# Patient Record
Sex: Female | Born: 1990 | Race: Asian | Hispanic: No | Marital: Married | Smoking: Never smoker
Health system: Southern US, Community
[De-identification: ages and names within clinical notes are randomized; demographics above are authoritative.]

---

## 2011-11-04 ENCOUNTER — Emergency Department
Admission: EM | Admit: 2011-11-04 | Discharge: 2011-11-04 | Disposition: A | Discharge status: Home / Self Care | Payer: Self-pay | Source: Home / Self Care | Attending: Family Medicine | Admitting: Family Medicine

## 2011-11-04 ENCOUNTER — Encounter: Payer: Self-pay | Admitting: Emergency Medicine

## 2011-11-04 DIAGNOSIS — L03031 Cellulitis of right toe: Secondary | ICD-10-CM

## 2011-11-04 DIAGNOSIS — L03039 Cellulitis of unspecified toe: Secondary | ICD-10-CM

## 2011-11-04 MED ORDER — CEPHALEXIN 500 MG PO CAPS: 500.0000 mg | ORAL_CAPSULE | Freq: Three times a day (TID) | ORAL | Status: AC

## 2011-11-04 NOTE — ED Provider Notes (Signed)
History     CSN: [id number]  Arrival date & time 11/04/11  1101   First MD Initiated Contact with Patient 11/04/11 1211      Chief Complaint  Patient presents with  . Toe Injury      HPI Comments: Patient reports dropping heavy bag on right large toe 2 weeks ago; then stubbed it and bumped it a couple times over past 2 weeks; continues to have liquid (non-blood) discharge when she presses on it, which also causes pain.  Patient is a 21 y.o. female presenting with toe pain. The history is provided by the patient.  Toe Pain This is a new problem. Episode onset: 2 weeks ago. The problem occurs constantly. The problem has not changed since onset.Associated symptoms comments: Drainage from toenail. Nothing aggravates the symptoms. Nothing relieves the symptoms. Treatments tried: soaks. The treatment provided no relief.    History reviewed. No pertinent past medical history.  History reviewed. No pertinent past surgical history.  History reviewed. No pertinent family history.  History  Substance Use Topics  . Smoking status: Never Smoker   . Smokeless tobacco: Not on file  . Alcohol Use: No    OB History    Grav Para Term Preterm Abortions TAB SAB Ect Mult Living                  Review of Systems  All other systems reviewed and are negative.    Allergies  Review of patient's allergies indicates no known allergies.  Home Medications   Current Outpatient Rx  Name Route Sig Dispense Refill  . CEPHALEXIN 500 MG PO CAPS Oral Take 1 capsule (500 mg total) by mouth 3 (three) times daily. 30 capsule 0    BP 97/62  Pulse 80  Temp 98.4 F (36.9 C) (Oral)  Resp 16  Ht 5\' 5"  (1.651 m)  Wt 146 lb (66.225 kg)  BMI 24.30 kg/m2  SpO2 100%  LMP 10/19/2011  Physical Exam Nursing notes and Vital Signs reviewed. Appearance:  Patient appears healthy, stated age, and in no acute distress Eyes:  Pupils are equal, round, and reactive to light and accomodation.  Extraocular  movement is intact.  Conjunctivae are not inflamed  Extremities:  No edema.  No calf tenderness.  Right great toe has mild tenderness distal phalanx at edges of toenail, but no erythema or swelling.  Toenail is loose and a small amount of discharge can be expressed from beneath nail.  Distal Neurovascular function is intact.  Skin:  No rash present.   ED Course  Procedures  none   Labs Reviewed  WOUND CULTURE pending      1. Paronychia of great toe of right foot       MDM  Wound culture pending Begin Keflex Followup with Family Doctor if not improved in 10 days.        Lattie Haw, MD 11/06/11 1150

## 2011-11-04 NOTE — ED Notes (Signed)
Patient reports dropping heavy bag on right large toe 2 weeks ago; then stubbed it and bumped it a couple times over past 2 weeks; continues to have liquid (non-blood) discharge when she presses on it, which also causes pain.

## 2011-11-06 LAB — WOUND CULTURE

## 2013-02-23 ENCOUNTER — Encounter: Payer: Self-pay | Admitting: Emergency Medicine

## 2013-02-23 ENCOUNTER — Emergency Department
Admission: EM | Admit: 2013-02-23 | Discharge: 2013-02-23 | Disposition: A | Discharge status: Home / Self Care | Payer: Self-pay | Source: Home / Self Care | Attending: Family Medicine | Admitting: Family Medicine

## 2013-02-23 ENCOUNTER — Emergency Department (INDEPENDENT_AMBULATORY_CARE_PROVIDER_SITE_OTHER): Payer: Self-pay

## 2013-02-23 DIAGNOSIS — W19XXXA Unspecified fall, initial encounter: Secondary | ICD-10-CM

## 2013-02-23 DIAGNOSIS — M25469 Effusion, unspecified knee: Secondary | ICD-10-CM

## 2013-02-23 DIAGNOSIS — IMO0002 Reserved for concepts with insufficient information to code with codable children: Secondary | ICD-10-CM

## 2013-02-23 DIAGNOSIS — S8391XA Sprain of unspecified site of right knee, initial encounter: Secondary | ICD-10-CM

## 2013-02-23 NOTE — ED Provider Notes (Signed)
CSN: [id number]     Arrival date & time 02/23/13  1129 History   First MD Initiated Contact with Patient 02/23/13 1147     Chief Complaint  Patient presents with  . Knee Pain    right knee pain for 1 day      HPI Comments: Patient reports that she was standing in a narrow space yesterday.  She recalls twisting her body to turn around but her right knee suddenly gave way and she fell.  She has had persistent pain/swelling in her right knee.  Patient is a 23 y.o. female presenting with knee pain. The history is provided by the patient and the spouse.  Knee Pain Location:  Knee Time since incident:  1 day Injury: yes   Mechanism of injury: fall   Fall:    Fall occurred:  Standing Knee location:  R knee Pain details:    Quality:  Throbbing   Radiates to:  Does not radiate   Severity:  Moderate   Onset quality:  Sudden   Duration:  1 day   Timing:  Constant   Progression:  Worsening Chronicity:  New Dislocation: no   Prior injury to area:  No Relieved by:  Nothing Worsened by:  Bearing weight and flexion Ineffective treatments:  NSAIDs Associated symptoms: decreased ROM, stiffness and swelling   Associated symptoms: no back pain, no fever, no muscle weakness, no numbness and no tingling     History reviewed. No pertinent past medical history. History reviewed. No pertinent past surgical history. History reviewed. No pertinent family history. History  Substance Use Topics  . Smoking status: Never Smoker   . Smokeless tobacco: Not on file  . Alcohol Use: No   OB History   Grav Para Term Preterm Abortions TAB SAB Ect Mult Living                 Review of Systems  Constitutional: Negative for fever.  Musculoskeletal: Positive for stiffness. Negative for back pain.  All other systems reviewed and are negative.    Allergies  Review of patient's allergies indicates no known allergies.  Home Medications  No current outpatient prescriptions on file. BP 96/67  Pulse  109  Temp(Src) 97.8 F (36.6 C) (Oral)  Ht 5\' 5"  (1.651 m)  Wt 150 lb (68.04 kg)  BMI 24.96 kg/m2  SpO2 100%  LMP 02/14/2013 Physical Exam  Nursing note and vitals reviewed. Constitutional: She is oriented to person, place, and time. She appears well-developed and well-nourished.  HENT:  Head: Normocephalic.  Eyes: Conjunctivae are normal. Pupils are equal, round, and reactive to light.  Musculoskeletal:       Right knee: She exhibits decreased range of motion, swelling, abnormal patellar mobility and abnormal meniscus. She exhibits no ecchymosis, no deformity, no laceration, no erythema, normal alignment and no LCL laxity. Tenderness found. Medial joint line and MCL tenderness noted. No lateral joint line, no LCL and no patellar tendon tenderness noted.       Legs: Patient has difficulty with full extension and full flexion.  Effusion/swelling anteriorly.  Negative drawer.  Tenderness MCL.  +/- McMurray test.  Neurological: She is alert and oriented to person, place, and time.  Skin: Skin is warm and dry.    ED Course  Procedures  none   Imaging Review Dg Knee Complete 4 Views Right  02/23/2013   CLINICAL DATA:  Right knee pain.  Fall.  EXAM: RIGHT KNEE - COMPLETE 4+ VIEW  COMPARISON:  None.  FINDINGS: Moderate to large knee effusion. No fracture or acute bony findings identified.  IMPRESSION: 1. Moderate to large knee effusion in the suprapatellar bursa. No acute bony findings. If pain persists despite conservative therapy, MRI may be warranted for further characterization.   Electronically Signed   By: Herbie BaltimoreWalt  Liebkemann M.D.   On: 02/23/2013 12:39      MDM   1. Sprain of right knee, initial encounter; ?spontaneously reduced patellar subluxation. ?MCL sprain. ?meniscal injury    Knee brace applied. Dispensed crutches. Apply ice pack for 30 minutes every 1 to 2 hours today and tomorrow.  Elevate.  Use crutches for 3 to 5 days.   Wear brace for about 2 to 3 weeks. Begin range of  motion and stretching exercises in about 5 days as per instruction sheet.  May take Ibuprofen 200mg , 4 tabs every 8 hours with food.  Followup with Dr. Rodney Langtonhomas The[facility]kekand[facility] ([facility]) if not improving about 4 weeks.     Lattie HawStephen A Grainne Knights, MD 02/24/13 1055

## 2013-02-23 NOTE — ED Notes (Signed)
Marissa Robinson states she was standing and went to sit down and her knee gave way yesterday. Now she has pain with movement and is a 8/10 sharp and throbbing pain.

## 2013-02-23 NOTE — Discharge Instructions (Signed)
Apply ice pack for 30 minutes every 1 to 2 hours today and tomorrow.  Elevate.  Use crutches for 3 to 5 days.   Wear brace for about 2 to 3 weeks. Begin range of motion and stretching exercises in about 5 days as per instruction sheet.  May take Ibuprofen 200mg , 4 tabs every 8 hours with food.    Knee Sprain A knee sprain is a tear in one of the strong, fibrous tissues that connect the bones (ligaments) in your knee. The severity of the sprain depends on how much of the ligament is torn. The tear can be either partial or complete. CAUSES  Often, sprains are a result of a fall or injury. The force of the impact causes the fibers of your ligament to stretch too much. This excess tension causes the fibers of your ligament to tear. SYMPTOMS  You may have some loss of motion in your knee. Other symptoms include:  Bruising.  Tenderness.  Swelling. DIAGNOSIS  In order to diagnose knee sprain, your caregiver will physically examine your knee to determine how torn the ligament is. Your caregiver may also suggest an X-ray exam of your knee to make sure no bones are broken. TREATMENT  If your ligament is only partially torn, treatment usually involves keeping the knee in a fixed position (immobilization) or bracing your knee for activities that require movement for several weeks. To do this, your caregiver will apply a bandage, cast, or splint to keep your knee from moving or support your knee during movement until it heals. For a partially torn ligament, the healing process usually takes 4 to 6 weeks. If your ligament is completely torn, depending on which ligament it is, you may need surgery to reconnect the ligament to the bone or reconstruct it. After surgery, a cast or splint may be applied and will need to stay on your knee for 4 to 6 weeks while your ligament heals. HOME CARE INSTRUCTIONS  Keep your injured knee elevated to decrease swelling.  To ease pain and swelling, apply ice to your knee  twice a day, for 2 to 3 days:  Put ice in a plastic bag.  Place a towel between your skin and the bag.  Leave the ice on for 15 minutes.  Only take over-the-counter or prescription medicine for pain as directed by your caregiver.  Do not leave your knee unprotected until pain and stiffness go away (usually 4 to 6 weeks).  Do not allow your cast or splint to get wet. If you have been instructed not to remove it, cover your cast or splint with a plastic bag when you shower or bathe. Do not swim.  Your caregiver may suggest exercises for you to do during your recovery to prevent or limit permanent weakness and stiffness. SEEK IMMEDIATE MEDICAL CARE IF:  Your cast or splint becomes damaged.  Your pain becomes worse. MAKE SURE YOU:  Understand these instructions.  Will watch your condition.  Will get help right away if you are not doing well or get worse. Document Released: 02/06/2005 Document Revised: 05/01/2011 Document Reviewed: 09/18/2012 [facility] Patient Information 2014 [place], [place].

## 2014-12-09 IMAGING — CR DG KNEE COMPLETE 4+V*R*
4 series · 4 of 4 positions shown · non-contrast
Comparison: None.

CLINICAL DATA: Right knee pain.  Fall.

EXAM:
RIGHT KNEE - COMPLETE 4+ VIEW

[view not recorded (1 of 4)]
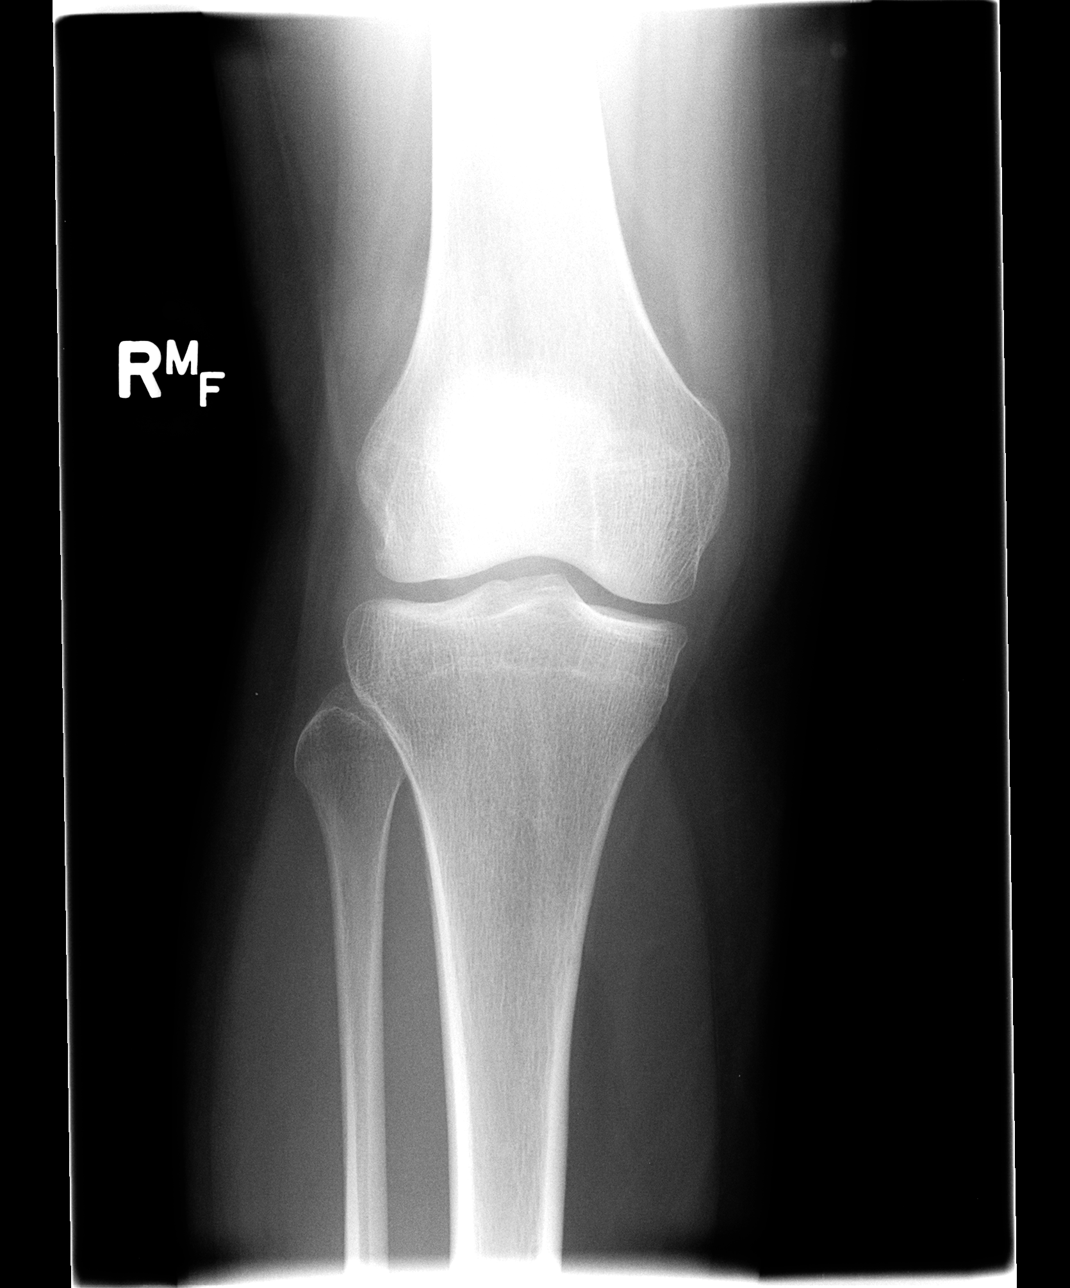

[view not recorded (2 of 4)]
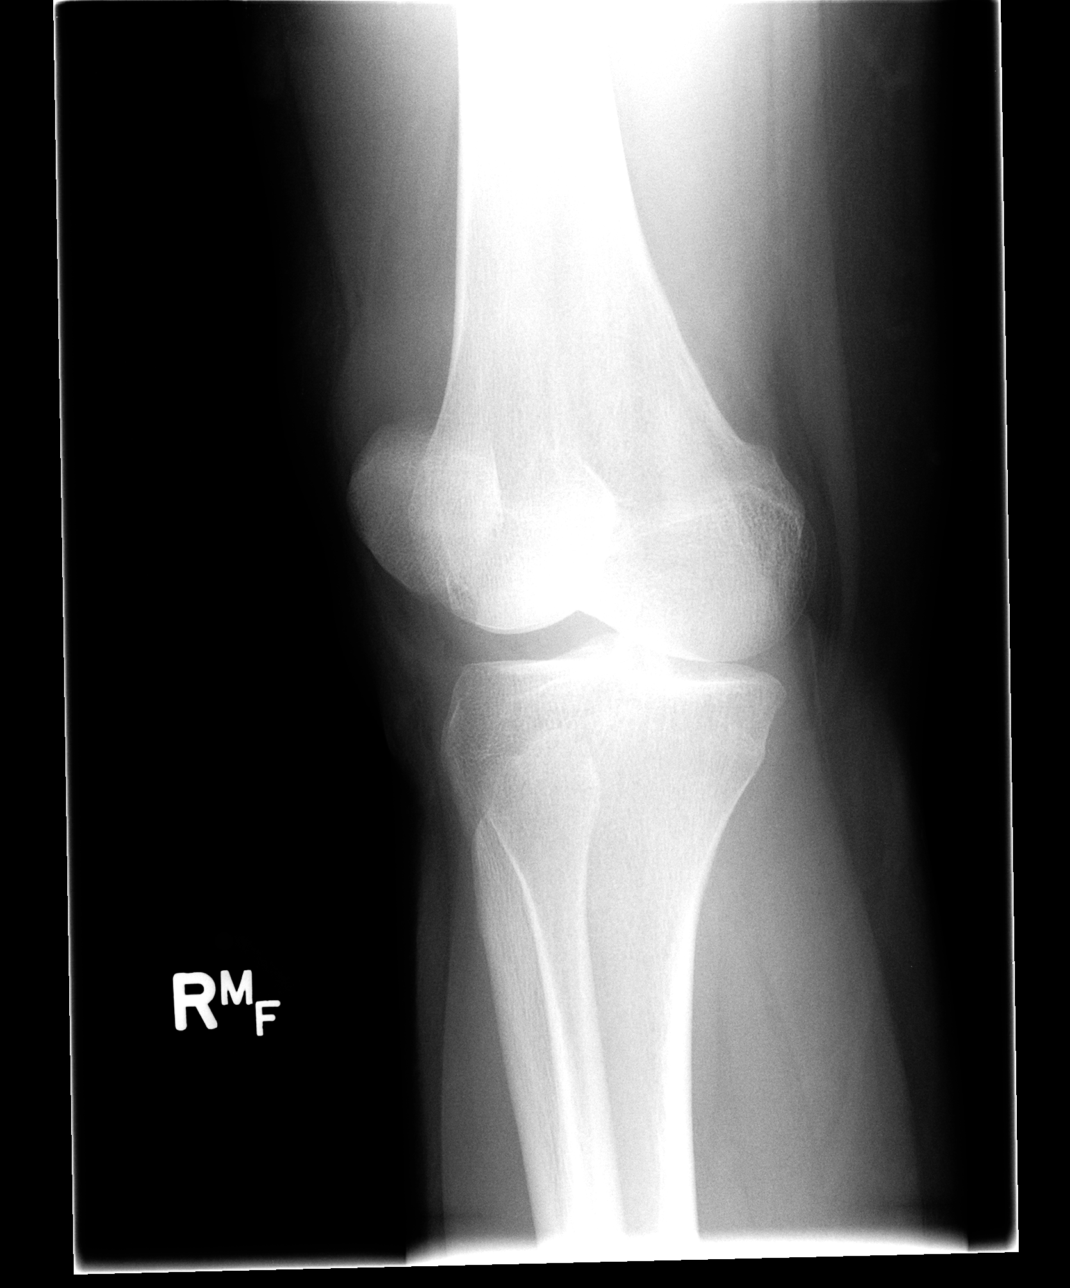

[view not recorded (3 of 4)]
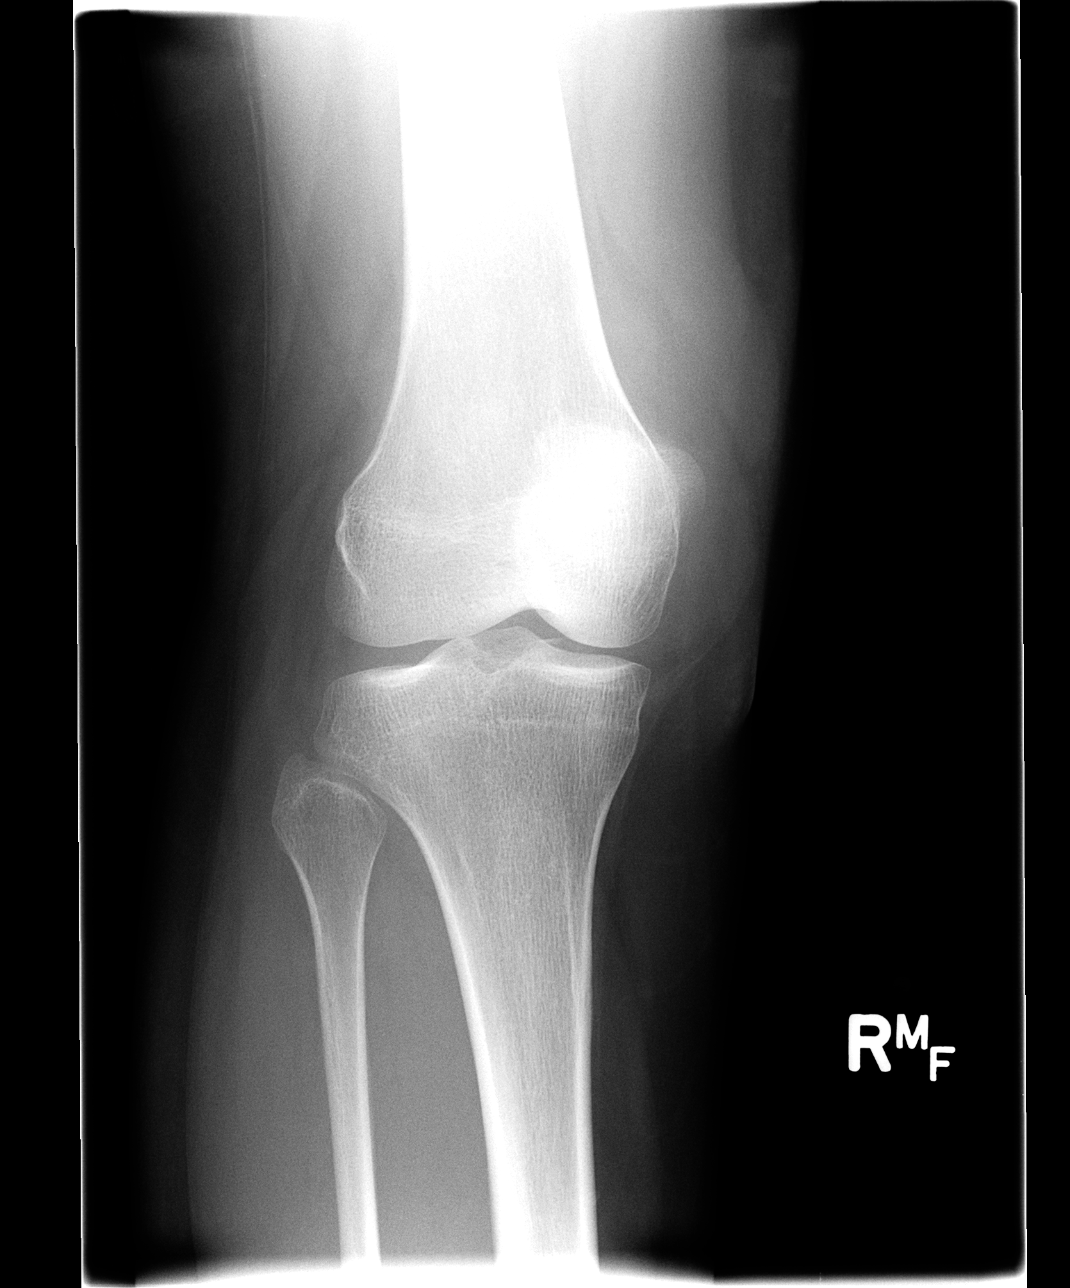

[view not recorded (4 of 4)]
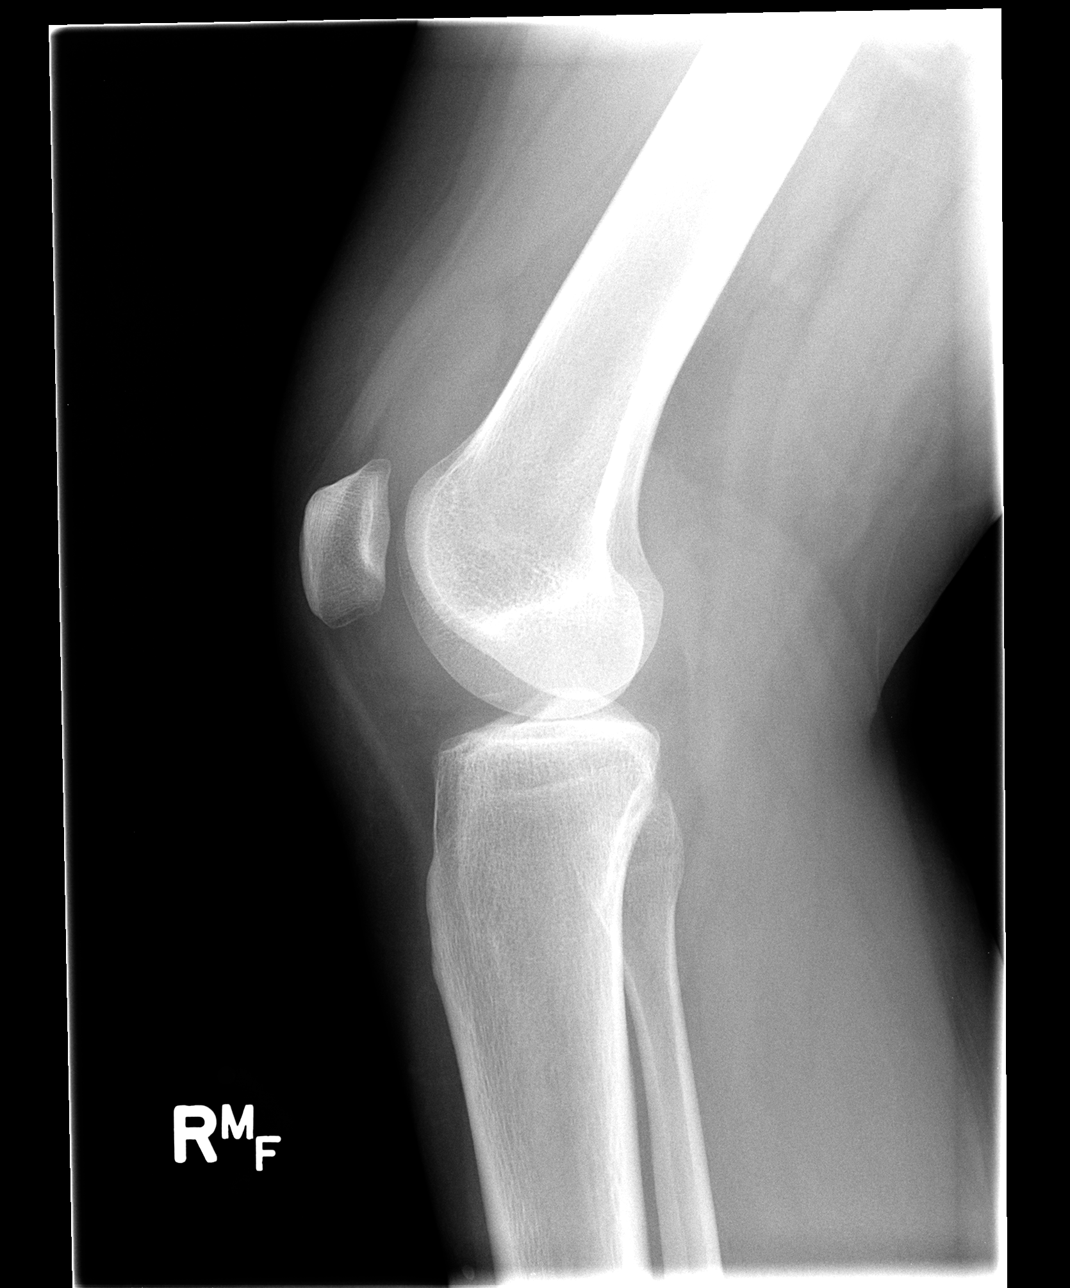

[4 of 4 positions shown; findings below may reference images not displayed]

FINDINGS: Moderate to large knee effusion. No fracture or acute bony findings
identified.
IMPRESSION: 1. Moderate to large knee effusion in the suprapatellar bursa. No
acute bony findings. If pain persists despite conservative therapy,
MRI may be warranted for further characterization.
